# Patient Record
Sex: Female | Born: 1950 | Race: White | Hispanic: No | Marital: Married | Smoking: Never smoker
Health system: Southern US, Community
[De-identification: ages and names within clinical notes are randomized; demographics above are authoritative.]

## PROBLEM LIST (undated history)

## (undated) DIAGNOSIS — F419 Anxiety disorder, unspecified: Secondary | ICD-10-CM

## (undated) HISTORY — DX: Anxiety disorder, unspecified: F41.9

## (undated) HISTORY — PX: ABDOMINAL HYSTERECTOMY: SHX81

## (undated) HISTORY — PX: TONSILLECTOMY: SUR1361

---

## 2001-03-09 ENCOUNTER — Encounter: Payer: Self-pay | Admitting: Family Medicine

## 2001-03-09 ENCOUNTER — Ambulatory Visit (HOSPITAL_COMMUNITY): Admission: RE | Admit: 2001-03-09 | Discharge: 2001-03-09 | Payer: Self-pay | Admitting: Family Medicine

## 2001-10-19 ENCOUNTER — Ambulatory Visit (HOSPITAL_COMMUNITY): Admission: RE | Admit: 2001-10-19 | Discharge: 2001-10-19 | Payer: Self-pay | Admitting: Obstetrics and Gynecology

## 2001-10-19 ENCOUNTER — Encounter: Payer: Self-pay | Admitting: Obstetrics and Gynecology

## 2003-08-31 ENCOUNTER — Ambulatory Visit (HOSPITAL_COMMUNITY): Admission: RE | Admit: 2003-08-31 | Discharge: 2003-08-31 | Payer: Self-pay | Admitting: Internal Medicine

## 2003-10-11 ENCOUNTER — Ambulatory Visit (HOSPITAL_COMMUNITY): Admission: RE | Admit: 2003-10-11 | Discharge: 2003-10-11 | Payer: Self-pay | Admitting: Obstetrics and Gynecology

## 2004-09-28 ENCOUNTER — Other Ambulatory Visit: Admission: RE | Admit: 2004-09-28 | Discharge: 2004-09-28 | Payer: Self-pay | Admitting: Dermatology

## 2004-10-23 ENCOUNTER — Ambulatory Visit (HOSPITAL_COMMUNITY): Admission: RE | Admit: 2004-10-23 | Discharge: 2004-10-23 | Payer: Self-pay | Admitting: Obstetrics and Gynecology

## 2005-10-25 ENCOUNTER — Ambulatory Visit (HOSPITAL_COMMUNITY): Admission: RE | Admit: 2005-10-25 | Discharge: 2005-10-25 | Payer: Self-pay | Admitting: Obstetrics and Gynecology

## 2006-10-20 ENCOUNTER — Ambulatory Visit (HOSPITAL_COMMUNITY): Admission: RE | Admit: 2006-10-20 | Discharge: 2006-10-20 | Payer: Self-pay | Admitting: Internal Medicine

## 2006-10-28 ENCOUNTER — Ambulatory Visit (HOSPITAL_COMMUNITY): Admission: RE | Admit: 2006-10-28 | Discharge: 2006-10-28 | Payer: Self-pay | Admitting: Internal Medicine

## 2007-10-29 ENCOUNTER — Ambulatory Visit (HOSPITAL_COMMUNITY): Admission: RE | Admit: 2007-10-29 | Discharge: 2007-10-29 | Payer: Self-pay | Admitting: Internal Medicine

## 2008-10-20 ENCOUNTER — Ambulatory Visit (HOSPITAL_COMMUNITY): Admission: RE | Admit: 2008-10-20 | Discharge: 2008-10-20 | Payer: Self-pay | Admitting: Internal Medicine

## 2008-10-27 ENCOUNTER — Ambulatory Visit (HOSPITAL_COMMUNITY): Admission: RE | Admit: 2008-10-27 | Discharge: 2008-10-27 | Payer: Self-pay | Admitting: Internal Medicine

## 2008-11-04 ENCOUNTER — Ambulatory Visit (HOSPITAL_COMMUNITY): Admission: RE | Admit: 2008-11-04 | Discharge: 2008-11-04 | Payer: Self-pay | Admitting: Obstetrics and Gynecology

## 2008-11-10 ENCOUNTER — Encounter (HOSPITAL_COMMUNITY): Admission: RE | Admit: 2008-11-10 | Discharge: 2008-12-10 | Payer: Self-pay | Admitting: Orthopaedic Surgery

## 2008-12-21 ENCOUNTER — Encounter (HOSPITAL_COMMUNITY): Admission: RE | Admit: 2008-12-21 | Discharge: 2009-01-12 | Payer: Self-pay | Admitting: Orthopaedic Surgery

## 2009-01-30 ENCOUNTER — Encounter (HOSPITAL_COMMUNITY): Admission: RE | Admit: 2009-01-30 | Discharge: 2009-03-01 | Payer: Self-pay | Admitting: Orthopaedic Surgery

## 2009-11-06 ENCOUNTER — Ambulatory Visit (HOSPITAL_COMMUNITY): Admission: RE | Admit: 2009-11-06 | Discharge: 2009-11-06 | Payer: Self-pay | Admitting: Internal Medicine

## 2010-10-08 ENCOUNTER — Other Ambulatory Visit (HOSPITAL_COMMUNITY): Payer: Self-pay | Admitting: Internal Medicine

## 2010-10-08 DIAGNOSIS — Z139 Encounter for screening, unspecified: Secondary | ICD-10-CM

## 2010-11-09 ENCOUNTER — Ambulatory Visit (HOSPITAL_COMMUNITY)
Admission: RE | Admit: 2010-11-09 | Discharge: 2010-11-09 | Disposition: A | Discharge status: Home / Self Care | Payer: BC Managed Care – PPO | Source: Ambulatory Visit | Attending: Internal Medicine | Admitting: Internal Medicine

## 2010-11-09 DIAGNOSIS — Z139 Encounter for screening, unspecified: Secondary | ICD-10-CM

## 2010-11-09 DIAGNOSIS — Z1231 Encounter for screening mammogram for malignant neoplasm of breast: Secondary | ICD-10-CM | POA: Insufficient documentation

## 2011-10-22 ENCOUNTER — Other Ambulatory Visit (HOSPITAL_COMMUNITY): Payer: Self-pay | Admitting: Internal Medicine

## 2011-10-22 DIAGNOSIS — Z139 Encounter for screening, unspecified: Secondary | ICD-10-CM

## 2011-11-11 ENCOUNTER — Ambulatory Visit (HOSPITAL_COMMUNITY)
Admission: RE | Admit: 2011-11-11 | Discharge: 2011-11-11 | Disposition: A | Discharge status: Home / Self Care | Payer: BC Managed Care – PPO | Source: Ambulatory Visit | Attending: Internal Medicine | Admitting: Internal Medicine

## 2011-11-11 DIAGNOSIS — Z1231 Encounter for screening mammogram for malignant neoplasm of breast: Secondary | ICD-10-CM | POA: Insufficient documentation

## 2011-11-11 DIAGNOSIS — Z139 Encounter for screening, unspecified: Secondary | ICD-10-CM

## 2011-11-18 ENCOUNTER — Other Ambulatory Visit (HOSPITAL_COMMUNITY): Payer: Self-pay | Admitting: Internal Medicine

## 2011-11-18 DIAGNOSIS — M810 Age-related osteoporosis without current pathological fracture: Secondary | ICD-10-CM

## 2011-11-21 ENCOUNTER — Ambulatory Visit (HOSPITAL_COMMUNITY)
Admission: RE | Admit: 2011-11-21 | Discharge: 2011-11-21 | Disposition: A | Discharge status: Home / Self Care | Payer: BC Managed Care – PPO | Source: Ambulatory Visit | Attending: Internal Medicine | Admitting: Internal Medicine

## 2011-11-21 DIAGNOSIS — M810 Age-related osteoporosis without current pathological fracture: Secondary | ICD-10-CM

## 2011-11-21 DIAGNOSIS — M818 Other osteoporosis without current pathological fracture: Secondary | ICD-10-CM | POA: Insufficient documentation

## 2011-11-21 DIAGNOSIS — Z780 Asymptomatic menopausal state: Secondary | ICD-10-CM | POA: Insufficient documentation

## 2012-10-23 ENCOUNTER — Other Ambulatory Visit (HOSPITAL_COMMUNITY): Payer: Self-pay | Admitting: Internal Medicine

## 2012-10-23 DIAGNOSIS — Z139 Encounter for screening, unspecified: Secondary | ICD-10-CM

## 2012-11-13 ENCOUNTER — Ambulatory Visit (HOSPITAL_COMMUNITY)
Admission: RE | Admit: 2012-11-13 | Discharge: 2012-11-13 | Disposition: A | Discharge status: Home / Self Care | Payer: BC Managed Care – PPO | Source: Ambulatory Visit | Attending: Internal Medicine | Admitting: Internal Medicine

## 2012-11-13 DIAGNOSIS — Z1231 Encounter for screening mammogram for malignant neoplasm of breast: Secondary | ICD-10-CM | POA: Insufficient documentation

## 2012-11-13 DIAGNOSIS — Z139 Encounter for screening, unspecified: Secondary | ICD-10-CM

## 2013-04-15 HISTORY — PX: COLONOSCOPY: SHX174

## 2013-11-01 ENCOUNTER — Other Ambulatory Visit (HOSPITAL_COMMUNITY): Payer: Self-pay | Admitting: Internal Medicine

## 2013-11-01 DIAGNOSIS — Z1231 Encounter for screening mammogram for malignant neoplasm of breast: Secondary | ICD-10-CM

## 2013-11-15 ENCOUNTER — Ambulatory Visit (HOSPITAL_COMMUNITY)
Admission: RE | Admit: 2013-11-15 | Discharge: 2013-11-15 | Disposition: A | Discharge status: Home / Self Care | Payer: BC Managed Care – PPO | Source: Ambulatory Visit | Attending: Internal Medicine | Admitting: Internal Medicine

## 2013-11-15 DIAGNOSIS — Z1231 Encounter for screening mammogram for malignant neoplasm of breast: Secondary | ICD-10-CM

## 2014-02-24 ENCOUNTER — Other Ambulatory Visit (HOSPITAL_COMMUNITY): Payer: Self-pay | Admitting: Internal Medicine

## 2014-02-24 DIAGNOSIS — M810 Age-related osteoporosis without current pathological fracture: Secondary | ICD-10-CM

## 2014-03-02 ENCOUNTER — Ambulatory Visit (HOSPITAL_COMMUNITY)
Admission: RE | Admit: 2014-03-02 | Discharge: 2014-03-02 | Disposition: A | Discharge status: Home / Self Care | Payer: BC Managed Care – PPO | Source: Ambulatory Visit | Attending: Internal Medicine | Admitting: Internal Medicine

## 2014-03-02 DIAGNOSIS — Z780 Asymptomatic menopausal state: Secondary | ICD-10-CM | POA: Diagnosis not present

## 2014-03-02 DIAGNOSIS — Z09 Encounter for follow-up examination after completed treatment for conditions other than malignant neoplasm: Secondary | ICD-10-CM | POA: Diagnosis not present

## 2014-03-02 DIAGNOSIS — R29890 Loss of height: Secondary | ICD-10-CM | POA: Diagnosis not present

## 2014-03-02 DIAGNOSIS — M810 Age-related osteoporosis without current pathological fracture: Secondary | ICD-10-CM | POA: Diagnosis not present

## 2014-11-28 ENCOUNTER — Other Ambulatory Visit (HOSPITAL_COMMUNITY): Payer: Self-pay | Admitting: Internal Medicine

## 2014-11-28 DIAGNOSIS — Z1231 Encounter for screening mammogram for malignant neoplasm of breast: Secondary | ICD-10-CM

## 2014-12-01 ENCOUNTER — Ambulatory Visit (HOSPITAL_COMMUNITY)
Admission: RE | Admit: 2014-12-01 | Discharge: 2014-12-01 | Disposition: A | Discharge status: Home / Self Care | Payer: BC Managed Care – PPO | Source: Ambulatory Visit | Attending: Internal Medicine | Admitting: Internal Medicine

## 2014-12-01 DIAGNOSIS — Z1231 Encounter for screening mammogram for malignant neoplasm of breast: Secondary | ICD-10-CM | POA: Insufficient documentation

## 2015-12-28 ENCOUNTER — Other Ambulatory Visit (HOSPITAL_COMMUNITY): Payer: Self-pay | Admitting: Internal Medicine

## 2015-12-28 DIAGNOSIS — Z1231 Encounter for screening mammogram for malignant neoplasm of breast: Secondary | ICD-10-CM

## 2015-12-29 ENCOUNTER — Ambulatory Visit (HOSPITAL_COMMUNITY)
Admission: RE | Admit: 2015-12-29 | Discharge: 2015-12-29 | Disposition: A | Discharge status: Home / Self Care | Payer: BC Managed Care – PPO | Source: Ambulatory Visit | Attending: Internal Medicine | Admitting: Internal Medicine

## 2015-12-29 DIAGNOSIS — Z1231 Encounter for screening mammogram for malignant neoplasm of breast: Secondary | ICD-10-CM | POA: Insufficient documentation

## 2016-03-22 ENCOUNTER — Other Ambulatory Visit (HOSPITAL_COMMUNITY): Payer: Self-pay | Admitting: Internal Medicine

## 2016-03-22 DIAGNOSIS — Z780 Asymptomatic menopausal state: Secondary | ICD-10-CM

## 2016-03-26 ENCOUNTER — Ambulatory Visit (HOSPITAL_COMMUNITY)
Admission: RE | Admit: 2016-03-26 | Discharge: 2016-03-26 | Disposition: A | Discharge status: Home / Self Care | Payer: BC Managed Care – PPO | Source: Ambulatory Visit | Attending: Internal Medicine | Admitting: Internal Medicine

## 2016-03-26 DIAGNOSIS — Z780 Asymptomatic menopausal state: Secondary | ICD-10-CM | POA: Diagnosis present

## 2016-03-26 DIAGNOSIS — M810 Age-related osteoporosis without current pathological fracture: Secondary | ICD-10-CM | POA: Diagnosis not present

## 2016-04-12 ENCOUNTER — Encounter (HOSPITAL_COMMUNITY)
Admission: RE | Admit: 2016-04-12 | Discharge: 2016-04-12 | Disposition: A | Discharge status: Home / Self Care | Payer: BC Managed Care – PPO | Source: Ambulatory Visit | Attending: Internal Medicine | Admitting: Internal Medicine

## 2016-04-12 ENCOUNTER — Encounter (HOSPITAL_COMMUNITY): Payer: BC Managed Care – PPO

## 2016-05-27 ENCOUNTER — Encounter: Payer: Self-pay | Admitting: Internal Medicine

## 2016-06-05 ENCOUNTER — Encounter: Payer: Self-pay | Admitting: Nurse Practitioner

## 2016-06-05 ENCOUNTER — Ambulatory Visit (INDEPENDENT_AMBULATORY_CARE_PROVIDER_SITE_OTHER): Payer: BC Managed Care – PPO | Admitting: Nurse Practitioner

## 2016-06-05 VITALS — BP 147/83 | HR 71 | Temp 98.1°F | Ht 65.0 in | Wt 125.4 lb

## 2016-06-05 DIAGNOSIS — R197 Diarrhea, unspecified: Secondary | ICD-10-CM

## 2016-06-05 DIAGNOSIS — R109 Unspecified abdominal pain: Secondary | ICD-10-CM | POA: Insufficient documentation

## 2016-06-05 DIAGNOSIS — R1030 Lower abdominal pain, unspecified: Secondary | ICD-10-CM

## 2016-06-05 MED ORDER — DICYCLOMINE HCL 10 MG PO CAPS: 10.0000 mg | ORAL_CAPSULE | Freq: Three times a day (TID) | ORAL | 1 refills | Status: DC

## 2016-06-05 NOTE — Assessment & Plan Note (Signed)
Patient describes lower abdominal cramping which is associated with her diarrhea. This supports the idea of postinfectious IBS. See diarrhea plan note per above. Stool studies, Bentyl, probiotic. Return for follow-up in 2 months. Last colonoscopy 2015 by Dr. Sinclair Groomsimothy Mason in [place], [place]. We will request these records.

## 2016-06-05 NOTE — Progress Notes (Signed)
cc'ed to pcp °

## 2016-06-05 NOTE — Assessment & Plan Note (Signed)
Patient describes intermittent watery diarrhea since July of last year. Typically will have about that will last up to one to 2 months with then resolution and normal stools in between. She did have diarrhea this morning. This all started when she traveled to [place]. Possible initial insult of gastroenteritis with a component of postinfectious irritable bowel syndrome. However, I will order stool studies today including C. difficile and GI pathogen panel. I will start her on a probiotic for 60 days. I will give her Bentyl for symptomatic relief. Return for follow-up in 2 months.

## 2016-06-05 NOTE — Patient Instructions (Signed)
1. We will give you cups to collect stool sample. 2. Collect this and bring it to the lab if you're having liquid stools. 3. If you're having solid stools call notify us, keep the cups into you have liquid stools at which point we can complete the test. 4. After you have provided stool sample, start taking probiotic, which is available over-the-counter. 5. I sent in a prescription for Bentyl 10 mg. He can take this up to 4 times a day (at mealtimes and at bedtime) as needed for abdominal cramping or diarrhea. 6. Return for follow-up in 2 months.

## 2016-06-05 NOTE — Progress Notes (Signed)
Primary Care Physician:  Carylon PerchesFAGAN,ROY, MD Primary Gastroenterologist:  Dr. Jena Gaussourk  Chief Complaint  Patient presents with  . Diarrhea  . Abdominal Pain    HPI:   Anne Green is a 66 y.o. female who presents On referral from primary care for diarrhea. PCP notes reviewed. Last saw primary care to 03/21/2016 at which point she complained of watery diarrhea for 2 months, noted negative stool studies. Symptoms resolved however continues with intermittent episodes of diarrhea with normal stools in between, denies pain or bleeding. Last colonoscopy 2015. Has tried various dietary modifications without resolution. Also known to have anxiety, which is well controlled on Paxil 40 mg daily.  Today she states she's doing well overall. Her symptoms started in July in [place] and got "really sick" but no fever; Significant diarrhea at that time. Symptoms continued after trip for at least a month. This stopped for a month or two and then restarted. The day she received the stool studies her symptoms resolved. Has had a current bout of diarrhea for the past month and is resolving. Has occasional associated abdominal pain described as "not horrible" and crampy. Has some nausea, no vomiting. Denies hematochezia, melena, fever, chills, significant unintentional weight loss. Occasional fatigue when she doesn't sleep well from diarrhea. Has hemorrhoids which are well controlled. Denies chest pain, dyspnea, dizziness, lightheadedness, syncope, near syncope. Denies any other upper or lower GI symptoms.  Last colonoscopy in 2015 with Dr. Gabriel CirrieMason in [place], [place].   No past medical history on file.  Past Surgical History:  Procedure Laterality Date  . ABDOMINAL HYSTERECTOMY    . CESAREAN SECTION    . COLONOSCOPY  2015   [place] [facility]  . TONSILLECTOMY      Current Outpatient Prescriptions  Medication Sig Dispense Refill  . amphetamine-dextroamphetamine (ADDERALL) 15 MG tablet Take 15 mg by mouth daily as  needed.    . calcium carbonate (CALCIUM 600) 600 MG TABS tablet Take 600 mg by mouth 2 (two) times daily with a meal.    . Cholecalciferol (VITAMIN D3) 1000 units CAPS Take by mouth daily.    . Multiple Vitamin (MULTIVITAMIN) capsule Take 1 capsule by mouth daily.    Marland Kitchen. PARoxetine (PAXIL) 40 MG tablet     . simvastatin (ZOCOR) 40 MG tablet Take 40 mg by mouth daily.    . TURMERIC PO Take by mouth daily.     No current facility-administered medications for this visit.     Allergies as of 06/05/2016  . (No Known Allergies)    Family History  Problem Relation Age of Onset  . Colon cancer Cousin     Social History   Social History  . Marital status: Married    Spouse name: N/A  . Number of children: N/A  . Years of education: N/A   Occupational History  . Not on file.   Social History Main Topics  . Smoking status: Never Smoker  . Smokeless tobacco: Never Used  . Alcohol use Yes     Comment: 1 glass of wine at night  . Drug use: No  . Sexual activity: Not on file   Other Topics Concern  . Not on file   Social History Narrative  . No narrative on file    Review of Systems: General: Negative for anorexia, weight loss, fever, chills, fatigue, weakness. ENT: Negative for hoarseness, difficulty swallowing , nasal congestion. CV: Negative for chest pain, angina, palpitations, dyspnea on exertion, peripheral edema.  Respiratory: Negative  for dyspnea at rest, cough, sputum, wheezing.  GI: See history of present illness. MS: Negative for joint pain, low back pain.  Derm: Negative for rash or itching.  Endo: Negative for unusual weight change.  Heme: Negative for bruising or bleeding. Allergy: Negative for rash or hives.    Physical Exam: BP (!) 147/83   Pulse 71   Temp 98.1 F (36.7 C) (Oral)   Ht 5\' 5"  (1.651 m)   Wt 125 lb 6.4 oz (56.9 kg)   BMI 20.87 kg/m  General:   Alert and oriented. Pleasant and cooperative. Well-nourished and well-developed. Seems high  energy. Head:  Normocephalic and atraumatic. Eyes:  Without icterus, sclera clear and conjunctiva pink.  Ears:  Normal auditory acuity. Cardiovascular:  S1, S2 present without murmurs appreciated. Extremities without clubbing or edema. Respiratory:  Clear to auscultation bilaterally. No wheezes, rales, or rhonchi. No distress.  Gastrointestinal:  +BS, soft, non-tender and non-distended. No HSM noted. No guarding or rebound. No masses appreciated.  Rectal:  Deferred  Musculoskalatal:  Symmetrical without gross deformities. Normal posture. Neurologic:  Alert and oriented x4;  grossly normal neurologically. Psych:  Alert and cooperative. Normal mood and affect. Heme/Lymph/Immune: No excessive bruising noted.    06/05/2016 9:20 AM   Disclaimer: This note was dictated with voice recognition software. Similar sounding words can inadvertently be transcribed and may not be corrected upon review.

## 2016-06-10 ENCOUNTER — Other Ambulatory Visit: Payer: Self-pay | Admitting: Nurse Practitioner

## 2016-06-12 LAB — GI PROFILE, STOOL, PCR
ASTROVIRUS: NOT DETECTED
Adenovirus F 40/41: NOT DETECTED
C difficile toxin A/B: NOT DETECTED
CAMPYLOBACTER: NOT DETECTED
Cryptosporidium: NOT DETECTED
Cyclospora cayetanensis: NOT DETECTED
ENTEROAGGREGATIVE E COLI: NOT DETECTED
ENTEROPATHOGENIC E COLI: NOT DETECTED
ENTEROTOXIGENIC E COLI: NOT DETECTED
Entamoeba histolytica: NOT DETECTED
Giardia lamblia: NOT DETECTED
NOROVIRUS GI/GII: NOT DETECTED
Plesiomonas shigelloides: NOT DETECTED
ROTAVIRUS A: NOT DETECTED
SHIGA-TOXIN-PRODUCING E COLI: NOT DETECTED
Salmonella: NOT DETECTED
Sapovirus: NOT DETECTED
Shigella/Enteroinvasive E coli: NOT DETECTED
Vibrio cholerae: NOT DETECTED
Vibrio: NOT DETECTED
Yersinia enterocolitica: NOT DETECTED

## 2016-06-12 LAB — CLOSTRIDIUM DIFFICILE EIA: C difficile Toxins A+B, EIA: NEGATIVE

## 2016-08-06 ENCOUNTER — Ambulatory Visit: Payer: BC Managed Care – PPO | Admitting: Nurse Practitioner

## 2016-08-23 ENCOUNTER — Ambulatory Visit (INDEPENDENT_AMBULATORY_CARE_PROVIDER_SITE_OTHER): Payer: BC Managed Care – PPO | Admitting: Gastroenterology

## 2016-08-23 ENCOUNTER — Encounter: Payer: Self-pay | Admitting: Gastroenterology

## 2016-08-23 VITALS — BP 139/94 | HR 82 | Temp 96.6°F | Ht 65.0 in | Wt 125.6 lb

## 2016-08-23 DIAGNOSIS — R197 Diarrhea, unspecified: Secondary | ICD-10-CM

## 2016-08-23 NOTE — Progress Notes (Signed)
Primary Care Physician:  Carylon Perches, MD  Primary Gastroenterologist:  Roetta Sessions, MD   Chief Complaint  Patient presents with  . Abdominal Pain    middle  . Diarrhea    HPI:  Anne Green is a 66 y.o. female here For follow-up of intermittent diarrhea and abdominal pain. She was seen back in February for the same symptoms. Previously evaluated by PCP with reported negative stool studies. Last colonoscopy 2015 by Dr. Erskine Speed.   Patient symptoms initially began in July of last year. Get really sick on vacation while in [place]. Diarrhea persisted for at least a month after her trip and then stop for couple months only to return. She states she may have weeks in between where she feels well but than the diarrhea always comes back. Associated with crampy abdominal pain although not severe. Some nausea but no vomiting. No blood in the stool or melena.  She had repeat stool studies in February with negative GI profile and C. difficile. She was provided with prescription for Bentyl but she states she only takes it only occasionally if needed and usually takes at bedtime if she has overeaten during the day.  She continues probiotics and yogurt. She will use this helps quite a bit. She notes however that she continues to have to be really strict with her diet or she develops cramping and diarrhea. Used to never had this problem prior to last year. Avoid salads, nuts, beans. Can only eat cooked vegetables not raw. No weight loss. She states the current symptoms began again about a 1-2 weeks ago.   Current Outpatient Prescriptions  Medication Sig Dispense Refill  . amphetamine-dextroamphetamine (ADDERALL) 15 MG tablet Take 15 mg by mouth daily as needed.    . calcium carbonate (CALCIUM 600) 600 MG TABS tablet Take 600 mg by mouth 2 (two) times daily with a meal.    . Cholecalciferol (VITAMIN D3) 1000 units CAPS Take by mouth daily.    Marland Kitchen dicyclomine (BENTYL) 10 MG capsule Take 1 capsule  (10 mg total) by mouth 4 (four) times daily -  before meals and at bedtime. AS NEEDED FOR ABDOMINAL CRAMPING AND/OR DIARRHEA 90 capsule 1  . Multiple Vitamin (MULTIVITAMIN) capsule Take 1 capsule by mouth daily.    Marland Kitchen OVER THE COUNTER MEDICATION Probiotic by mouth once a day    . PARoxetine (PAXIL) 40 MG tablet Take 40 mg by mouth daily.      No current facility-administered medications for this visit.     Allergies as of 08/23/2016  . (No Known Allergies)    Past Medical History:  Diagnosis Date  . Anxiety     Past Surgical History:  Procedure Laterality Date  . ABDOMINAL HYSTERECTOMY    . CESAREAN SECTION    . COLONOSCOPY  2015   [place] [facility]  . TONSILLECTOMY      Family History  Problem Relation Age of Onset  . Colon cancer Cousin 50  . Colon cancer Paternal Aunt     Social History   Social History  . Marital status: Married    Spouse name: N/A  . Number of children: N/A  . Years of education: N/A   Occupational History  . Not on file.   Social History Main Topics  . Smoking status: Never Smoker  . Smokeless tobacco: Never Used  . Alcohol use Yes     Comment: 1 glass of wine at night  . Drug use: No  . Sexual activity:  Not on file   Other Topics Concern  . Not on file   Social History Narrative  . No narrative on file      ROS:  General: Negative for anorexia, weight loss, fever, chills, fatigue, weakness. Eyes: Negative for vision changes.  ENT: Negative for hoarseness, difficulty swallowing , nasal congestion. CV: Negative for chest pain, angina, palpitations, dyspnea on exertion, peripheral edema.  Respiratory: Negative for dyspnea at rest, dyspnea on exertion, cough, sputum, wheezing.  GI: See history of present illness. GU:  Negative for dysuria, hematuria, urinary incontinence, urinary frequency, nocturnal urination.  MS: Negative for joint pain, low back pain.  Derm: Negative for rash or itching.  Neuro: Negative for weakness,  abnormal sensation, seizure, frequent headaches, memory loss, confusion.  Psych: Negative for anxiety, depression, suicidal ideation, hallucinations.  Endo: Negative for unusual weight change.  Heme: Negative for bruising or bleeding. Allergy: Negative for rash or hives.    Physical Examination:  BP (!) 139/94   Pulse 82   Temp (!) 96.6 F (35.9 C) (Oral)   Ht 5\' 5"  (1.651 m)   Wt 125 lb 9.6 oz (57 kg)   BMI 20.90 kg/m    General: Well-nourished, well-developed in no acute distress.  Head: Normocephalic, atraumatic.   Eyes: Conjunctiva pink, no icterus. Mouth: Oropharyngeal mucosa moist and pink , no lesions erythema or exudate. Neck: Supple without thyromegaly, masses, or lymphadenopathy.  Lungs: Clear to auscultation bilaterally.  Heart: Regular rate and rhythm, no murmurs rubs or gallops.  Abdomen: Bowel sounds are normal, nontender, nondistended, no hepatosplenomegaly or masses, no abdominal bruits or    hernia , no rebound or guarding.   Rectal: not performed Extremities: No lower extremity edema. No clubbing or deformities.  Neuro: Alert and oriented x 4 , grossly normal neurologically.  Skin: Warm and dry, no rash or jaundice.   Psych: Alert and cooperative, normal mood and affect.

## 2016-08-23 NOTE — Assessment & Plan Note (Signed)
66 year old female presenting for intermittent diarrhea and abdominal discomfort. Symptoms began in July 2017 while on vacation. Initially had 10 watery stools daily. This persisted for a couple of months. Symptoms have now been more intermittent. Stool studies have been negative. Trial of Bentyl but typically would only take at bedtime in certain situations i.e. overeating. She's been taking probiotics and yogurt. States she can go few weeks without symptoms but then diarrhea always returns. She has to be extremely careful with what she eats or she develops diarrhea and vague abdominal discomfort. No significant abdominal pain  Retrieve labs the stool studies done by PCP. Would at least consider celiac screening, TSH. Would not rule out possibility for repeat colonoscopy with hospital random colon biopsies. Further recommendations to follow.

## 2016-08-23 NOTE — Patient Instructions (Signed)
1. We will request records from PCP. You may need additional labs. Will not exclude possibility of repeat colonoscopy.

## 2016-10-16 NOTE — Progress Notes (Signed)
Please let patient know that only labs received from PCP were from 2017.  Would advise labs including CBC with diff, TTG IgA, Iga level, CRP, Sedrate, TSH.

## 2016-10-17 ENCOUNTER — Other Ambulatory Visit: Payer: Self-pay | Admitting: Gastroenterology

## 2016-10-17 ENCOUNTER — Other Ambulatory Visit: Payer: Self-pay

## 2016-10-17 DIAGNOSIS — R197 Diarrhea, unspecified: Secondary | ICD-10-CM

## 2016-10-17 DIAGNOSIS — R1030 Lower abdominal pain, unspecified: Secondary | ICD-10-CM

## 2016-10-17 NOTE — Progress Notes (Signed)
Lab orders done and mailed to the pt with a letter.  

## 2017-01-07 ENCOUNTER — Other Ambulatory Visit (HOSPITAL_COMMUNITY): Payer: Self-pay | Admitting: Internal Medicine

## 2017-01-07 ENCOUNTER — Other Ambulatory Visit: Payer: Self-pay | Admitting: Nurse Practitioner

## 2017-01-07 DIAGNOSIS — R197 Diarrhea, unspecified: Secondary | ICD-10-CM

## 2017-01-07 DIAGNOSIS — Z1231 Encounter for screening mammogram for malignant neoplasm of breast: Secondary | ICD-10-CM

## 2017-01-07 DIAGNOSIS — R1030 Lower abdominal pain, unspecified: Secondary | ICD-10-CM

## 2017-01-09 ENCOUNTER — Ambulatory Visit (HOSPITAL_COMMUNITY)
Admission: RE | Admit: 2017-01-09 | Discharge: 2017-01-09 | Disposition: A | Discharge status: Home / Self Care | Payer: BC Managed Care – PPO | Source: Ambulatory Visit | Attending: Internal Medicine | Admitting: Internal Medicine

## 2017-01-09 DIAGNOSIS — Z1231 Encounter for screening mammogram for malignant neoplasm of breast: Secondary | ICD-10-CM | POA: Diagnosis present

## 2017-08-28 DIAGNOSIS — R69 Illness, unspecified: Secondary | ICD-10-CM | POA: Diagnosis not present

## 2017-09-03 ENCOUNTER — Encounter (HOSPITAL_COMMUNITY): Payer: Self-pay

## 2017-09-03 ENCOUNTER — Encounter (HOSPITAL_COMMUNITY)
Admission: RE | Admit: 2017-09-03 | Discharge: 2017-09-03 | Disposition: A | Discharge status: Home / Self Care | Payer: Medicare HMO | Source: Ambulatory Visit | Attending: Internal Medicine | Admitting: Internal Medicine

## 2017-09-03 DIAGNOSIS — M810 Age-related osteoporosis without current pathological fracture: Secondary | ICD-10-CM | POA: Diagnosis present

## 2017-09-03 LAB — COMPREHENSIVE METABOLIC PANEL
ALT: 30 U/L (ref 14–54)
AST: 21 U/L (ref 15–41)
Albumin: 3.9 g/dL (ref 3.5–5.0)
Alkaline Phosphatase: 51 U/L (ref 38–126)
Anion gap: 5 (ref 5–15)
BUN: 15 mg/dL (ref 6–20)
CHLORIDE: 104 mmol/L (ref 101–111)
CO2: 32 mmol/L (ref 22–32)
CREATININE: 0.65 mg/dL (ref 0.44–1.00)
Calcium: 9.5 mg/dL (ref 8.9–10.3)
Glucose, Bld: 110 mg/dL — ABNORMAL HIGH (ref 65–99)
Potassium: 4 mmol/L (ref 3.5–5.1)
Sodium: 141 mmol/L (ref 135–145)
Total Bilirubin: 0.6 mg/dL (ref 0.3–1.2)
Total Protein: 6.8 g/dL (ref 6.5–8.1)

## 2017-09-03 MED ORDER — ZOLEDRONIC ACID 5 MG/100ML IV SOLN
INTRAVENOUS | Status: AC
  Filled 2017-09-03: qty 100

## 2017-09-03 MED ORDER — SODIUM CHLORIDE 0.9 % IV SOLN
INTRAVENOUS | Status: DC
  Administered 2017-09-03: 12:00:00 via INTRAVENOUS

## 2017-09-03 MED ORDER — ZOLEDRONIC ACID 5 MG/100ML IV SOLN
5.0000 mg | Freq: Once | INTRAVENOUS | Status: AC
  Administered 2017-09-03: 5 mg via INTRAVENOUS

## 2017-12-04 DIAGNOSIS — H25811 Combined forms of age-related cataract, right eye: Secondary | ICD-10-CM | POA: Diagnosis not present

## 2017-12-04 DIAGNOSIS — H52213 Irregular astigmatism, bilateral: Secondary | ICD-10-CM | POA: Diagnosis not present

## 2017-12-04 DIAGNOSIS — H25812 Combined forms of age-related cataract, left eye: Secondary | ICD-10-CM | POA: Diagnosis not present

## 2017-12-24 DIAGNOSIS — H52213 Irregular astigmatism, bilateral: Secondary | ICD-10-CM | POA: Diagnosis not present

## 2017-12-24 DIAGNOSIS — E7800 Pure hypercholesterolemia, unspecified: Secondary | ICD-10-CM | POA: Diagnosis not present

## 2017-12-24 DIAGNOSIS — H25812 Combined forms of age-related cataract, left eye: Secondary | ICD-10-CM | POA: Diagnosis not present

## 2017-12-24 DIAGNOSIS — R69 Illness, unspecified: Secondary | ICD-10-CM | POA: Diagnosis not present

## 2017-12-24 DIAGNOSIS — H269 Unspecified cataract: Secondary | ICD-10-CM | POA: Diagnosis not present

## 2017-12-24 DIAGNOSIS — M1990 Unspecified osteoarthritis, unspecified site: Secondary | ICD-10-CM | POA: Diagnosis not present

## 2017-12-24 DIAGNOSIS — H25813 Combined forms of age-related cataract, bilateral: Secondary | ICD-10-CM | POA: Diagnosis not present

## 2018-01-02 DIAGNOSIS — M810 Age-related osteoporosis without current pathological fracture: Secondary | ICD-10-CM | POA: Diagnosis not present

## 2018-01-02 DIAGNOSIS — Z23 Encounter for immunization: Secondary | ICD-10-CM | POA: Diagnosis not present

## 2018-01-02 DIAGNOSIS — G44221 Chronic tension-type headache, intractable: Secondary | ICD-10-CM | POA: Diagnosis not present

## 2018-01-08 DIAGNOSIS — H25811 Combined forms of age-related cataract, right eye: Secondary | ICD-10-CM | POA: Diagnosis not present

## 2018-01-19 DIAGNOSIS — R51 Headache: Secondary | ICD-10-CM | POA: Diagnosis not present

## 2018-01-21 DIAGNOSIS — H269 Unspecified cataract: Secondary | ICD-10-CM | POA: Diagnosis not present

## 2018-01-21 DIAGNOSIS — H52213 Irregular astigmatism, bilateral: Secondary | ICD-10-CM | POA: Diagnosis not present

## 2018-01-21 DIAGNOSIS — H25811 Combined forms of age-related cataract, right eye: Secondary | ICD-10-CM | POA: Diagnosis not present

## 2018-01-21 DIAGNOSIS — M810 Age-related osteoporosis without current pathological fracture: Secondary | ICD-10-CM | POA: Diagnosis not present

## 2018-01-21 DIAGNOSIS — Z9842 Cataract extraction status, left eye: Secondary | ICD-10-CM | POA: Diagnosis not present

## 2018-01-21 DIAGNOSIS — R69 Illness, unspecified: Secondary | ICD-10-CM | POA: Diagnosis not present

## 2018-01-21 DIAGNOSIS — E7800 Pure hypercholesterolemia, unspecified: Secondary | ICD-10-CM | POA: Diagnosis not present

## 2018-01-22 ENCOUNTER — Other Ambulatory Visit (HOSPITAL_COMMUNITY): Payer: Self-pay | Admitting: Internal Medicine

## 2018-01-22 DIAGNOSIS — Z1231 Encounter for screening mammogram for malignant neoplasm of breast: Secondary | ICD-10-CM

## 2018-01-26 ENCOUNTER — Encounter (HOSPITAL_COMMUNITY): Payer: Self-pay

## 2018-01-26 ENCOUNTER — Ambulatory Visit (HOSPITAL_COMMUNITY)
Admission: RE | Admit: 2018-01-26 | Discharge: 2018-01-26 | Disposition: A | Discharge status: Home / Self Care | Payer: Medicare HMO | Source: Ambulatory Visit | Attending: Internal Medicine | Admitting: Internal Medicine

## 2018-01-26 DIAGNOSIS — Z1231 Encounter for screening mammogram for malignant neoplasm of breast: Secondary | ICD-10-CM

## 2018-01-27 DIAGNOSIS — Z23 Encounter for immunization: Secondary | ICD-10-CM | POA: Diagnosis not present

## 2018-02-25 DIAGNOSIS — Z23 Encounter for immunization: Secondary | ICD-10-CM | POA: Diagnosis not present

## 2018-02-25 DIAGNOSIS — R69 Illness, unspecified: Secondary | ICD-10-CM | POA: Diagnosis not present

## 2018-03-20 DIAGNOSIS — Z1231 Encounter for screening mammogram for malignant neoplasm of breast: Secondary | ICD-10-CM | POA: Diagnosis not present

## 2018-03-28 IMAGING — MG DIGITAL SCREENING BILATERAL MAMMOGRAM WITH CAD
5 series · 5 of 5 positions shown · non-contrast
Comparison: Previous exam(s).

CLINICAL DATA: Screening.

EXAM:
DIGITAL SCREENING BILATERAL MAMMOGRAM WITH CAD

[L MLO]
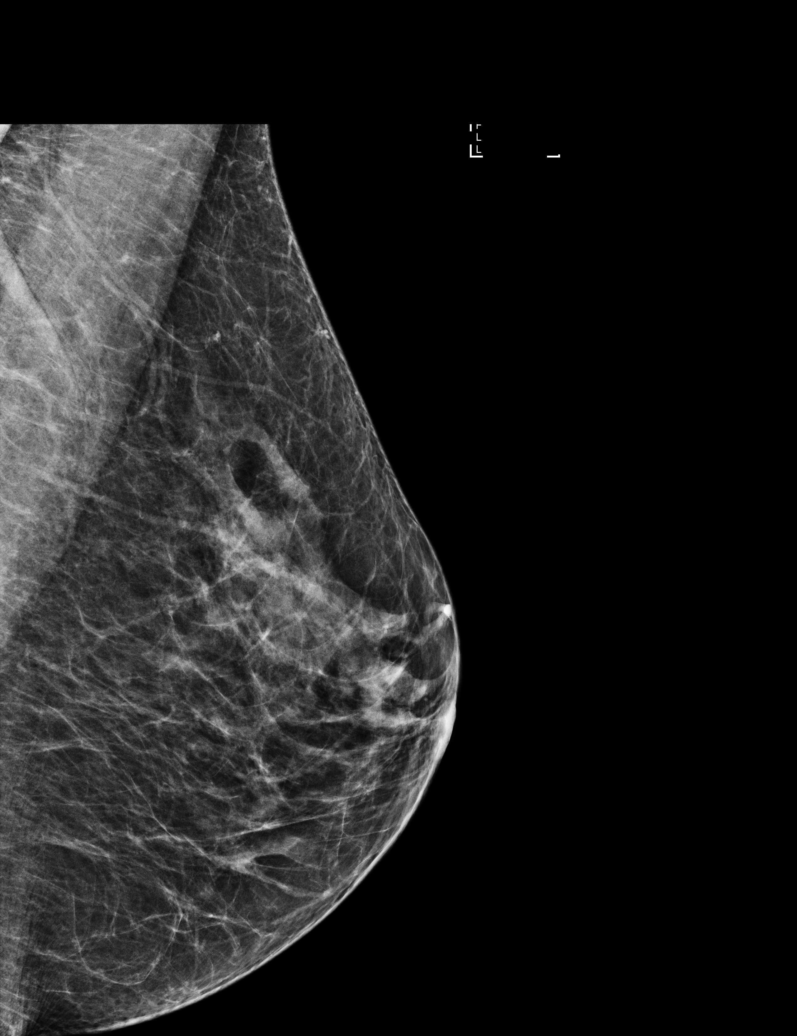

[R MLO (1 of 2)]
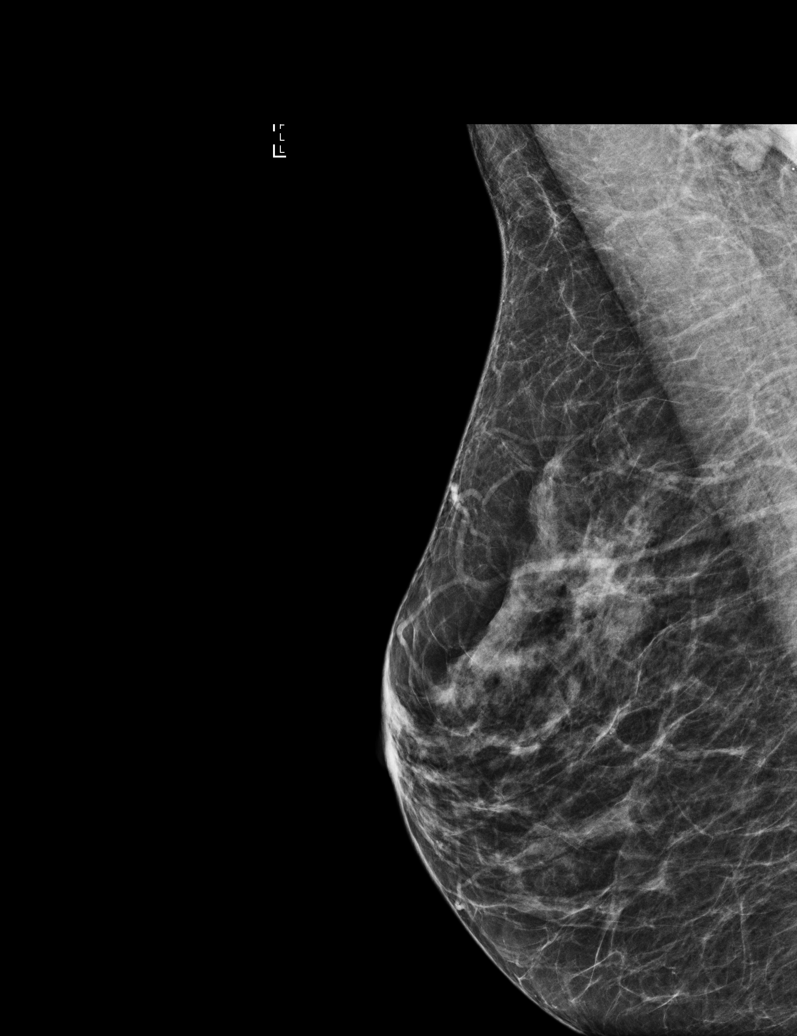

[R MLO (2 of 2)]
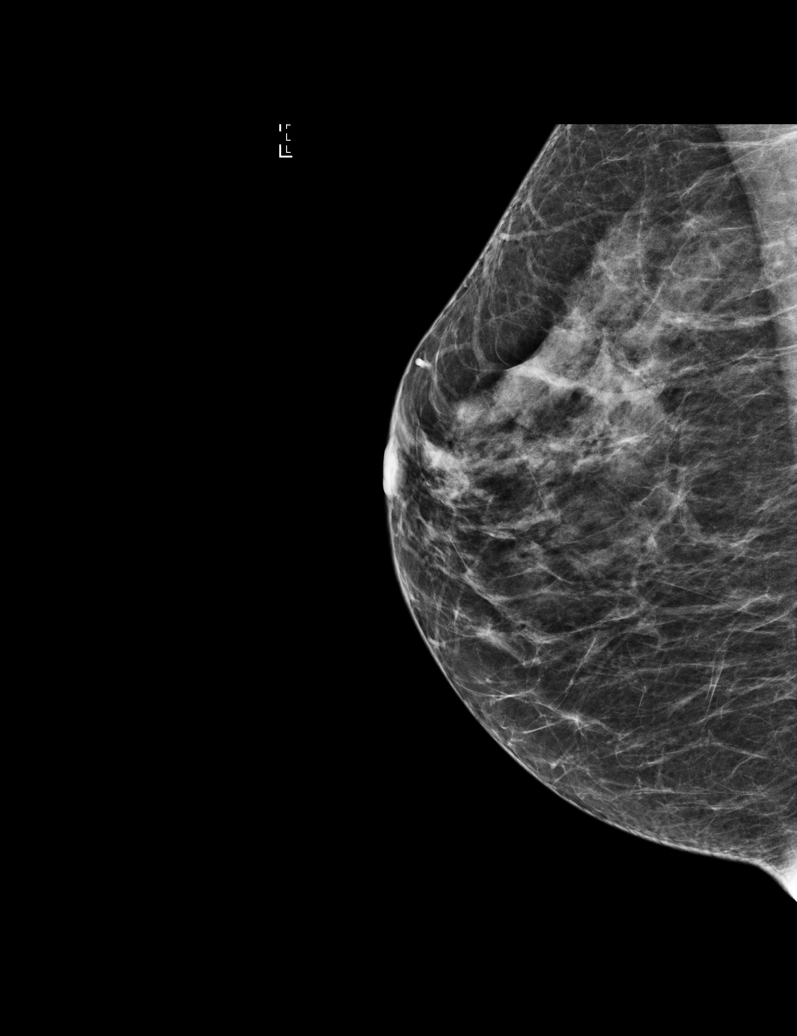

[R CC]
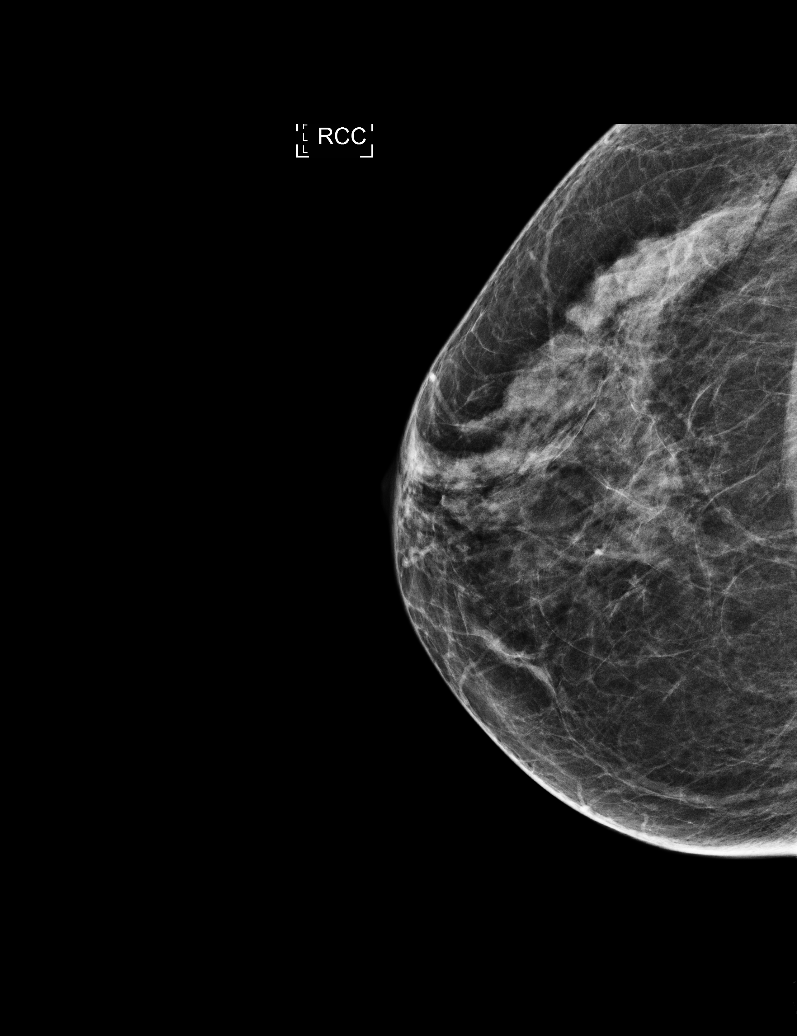

[L CC]
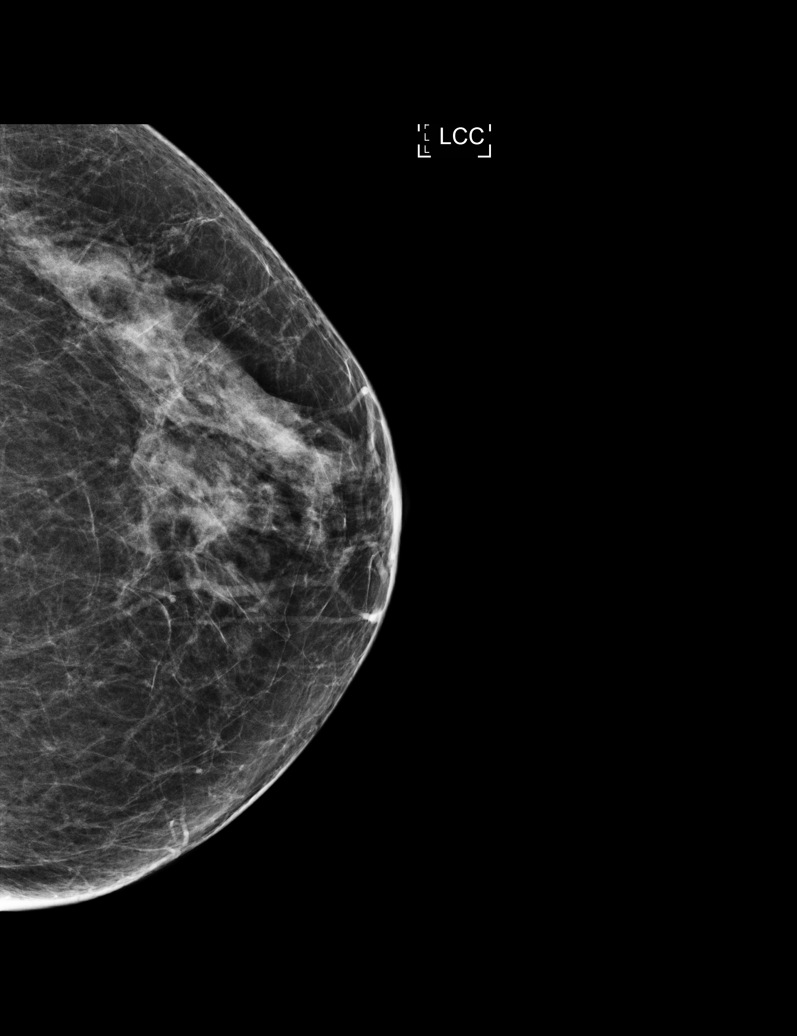

[5 of 5 positions shown; findings below may reference images not displayed]

ACR Breast Density Category b: There are scattered areas of
fibroglandular density.
FINDINGS: There are no findings suspicious for malignancy. Images were
processed with CAD.
IMPRESSION: No mammographic evidence of malignancy. A result letter of this
screening mammogram will be mailed directly to the patient.

RECOMMENDATION:
Screening mammogram in one year. (Code:AS-G-LCT)

BI-RADS CATEGORY  1: Negative.

## 2018-04-20 DIAGNOSIS — R69 Illness, unspecified: Secondary | ICD-10-CM | POA: Diagnosis not present

## 2018-04-20 DIAGNOSIS — M810 Age-related osteoporosis without current pathological fracture: Secondary | ICD-10-CM | POA: Diagnosis not present

## 2018-04-20 DIAGNOSIS — Z79899 Other long term (current) drug therapy: Secondary | ICD-10-CM | POA: Diagnosis not present

## 2018-04-20 DIAGNOSIS — E785 Hyperlipidemia, unspecified: Secondary | ICD-10-CM | POA: Diagnosis not present

## 2018-04-20 DIAGNOSIS — R030 Elevated blood-pressure reading, without diagnosis of hypertension: Secondary | ICD-10-CM | POA: Diagnosis not present

## 2018-04-27 DIAGNOSIS — E785 Hyperlipidemia, unspecified: Secondary | ICD-10-CM | POA: Diagnosis not present

## 2018-04-27 DIAGNOSIS — Z6822 Body mass index (BMI) 22.0-22.9, adult: Secondary | ICD-10-CM | POA: Diagnosis not present

## 2018-04-27 DIAGNOSIS — R69 Illness, unspecified: Secondary | ICD-10-CM | POA: Diagnosis not present

## 2018-06-30 DIAGNOSIS — M542 Cervicalgia: Secondary | ICD-10-CM | POA: Diagnosis not present

## 2018-09-28 DIAGNOSIS — R69 Illness, unspecified: Secondary | ICD-10-CM | POA: Diagnosis not present

## 2018-10-08 DIAGNOSIS — R69 Illness, unspecified: Secondary | ICD-10-CM | POA: Diagnosis not present

## 2018-10-12 ENCOUNTER — Encounter (HOSPITAL_COMMUNITY)
Admission: RE | Admit: 2018-10-12 | Discharge: 2018-10-12 | Disposition: A | Discharge status: Home / Self Care | Payer: Medicare HMO | Source: Ambulatory Visit | Attending: Internal Medicine | Admitting: Internal Medicine

## 2018-10-12 ENCOUNTER — Other Ambulatory Visit: Payer: Self-pay

## 2018-10-12 DIAGNOSIS — M810 Age-related osteoporosis without current pathological fracture: Secondary | ICD-10-CM | POA: Diagnosis not present

## 2018-10-12 MED ORDER — SODIUM CHLORIDE 0.9 % IV SOLN
Freq: Once | INTRAVENOUS | Status: AC
  Administered 2018-10-12: 14:00:00 via INTRAVENOUS

## 2018-10-12 MED ORDER — ZOLEDRONIC ACID 5 MG/100ML IV SOLN
5.0000 mg | Freq: Once | INTRAVENOUS | Status: AC
  Administered 2018-10-12: 5 mg via INTRAVENOUS
  Filled 2018-10-12: qty 100

## 2019-01-15 DIAGNOSIS — J329 Chronic sinusitis, unspecified: Secondary | ICD-10-CM | POA: Diagnosis not present

## 2019-10-12 ENCOUNTER — Encounter (HOSPITAL_COMMUNITY): Payer: BC Managed Care – PPO
